# Patient Record
Sex: Female | Born: 1960 | Race: White | Marital: Married
Health system: Southern US, Community
[De-identification: ages and names within clinical notes are randomized; demographics above are authoritative.]

---

## 2016-08-22 ENCOUNTER — Other Ambulatory Visit: Payer: Self-pay | Admitting: Orthopedic Surgery

## 2016-08-22 DIAGNOSIS — M50323 Other cervical disc degeneration at C6-C7 level: Secondary | ICD-10-CM

## 2016-09-02 ENCOUNTER — Other Ambulatory Visit: Payer: Self-pay

## 2016-09-05 ENCOUNTER — Other Ambulatory Visit: Payer: Self-pay

## 2016-09-11 ENCOUNTER — Other Ambulatory Visit: Payer: Self-pay

## 2016-09-15 ENCOUNTER — Inpatient Hospital Stay
Admission: RE | Admit: 2016-09-15 | Discharge: 2016-09-15 | Disposition: A | Discharge status: Home / Self Care | Payer: BLUE CROSS/BLUE SHIELD | Source: Ambulatory Visit | Attending: Orthopedic Surgery | Admitting: Orthopedic Surgery

## 2016-09-23 ENCOUNTER — Encounter: Payer: Self-pay | Admitting: Radiology

## 2016-09-23 ENCOUNTER — Ambulatory Visit
Admission: RE | Admit: 2016-09-23 | Discharge: 2016-09-23 | Disposition: A | Discharge status: Home / Self Care | Payer: BLUE CROSS/BLUE SHIELD | Source: Ambulatory Visit | Attending: Orthopedic Surgery | Admitting: Orthopedic Surgery

## 2016-09-23 DIAGNOSIS — M50323 Other cervical disc degeneration at C6-C7 level: Secondary | ICD-10-CM

## 2016-09-23 MED ORDER — TRIAMCINOLONE ACETONIDE 40 MG/ML IJ SUSP (RADIOLOGY)
60.0000 mg | Freq: Once | INTRAMUSCULAR | Status: AC
  Administered 2016-09-23: 60 mg via EPIDURAL

## 2016-09-23 MED ORDER — DIAZEPAM 5 MG PO TABS
10.0000 mg | ORAL_TABLET | Freq: Once | ORAL | Status: AC
  Administered 2016-09-23: 10 mg via ORAL

## 2016-09-23 MED ORDER — IOPAMIDOL (ISOVUE-M 300) INJECTION 61%
1.0000 mL | Freq: Once | INTRAMUSCULAR | Status: AC | PRN
  Administered 2016-09-23: 1 mL via EPIDURAL

## 2016-09-23 NOTE — Discharge Instructions (Signed)

## 2016-10-20 ENCOUNTER — Ambulatory Visit: Payer: Self-pay | Admitting: Physician Assistant

## 2016-11-12 ENCOUNTER — Ambulatory Visit: Admit: 2016-11-12 | Payer: BLUE CROSS/BLUE SHIELD | Admitting: Orthopedic Surgery

## 2016-11-12 SURGERY — ANTERIOR CERVICAL DECOMPRESSION/DISCECTOMY FUSION 2 LEVELS
Anesthesia: General

## 2018-02-28 IMAGING — XA DG INJECT/[PERSON_NAME] INC NEEDLE/CATH/PLC EPI/CERV/THOR W/IMG
2 series · 2 of 2 positions shown · non-contrast
Comparison: none

CLINICAL DATA: Spondylosis without myelopathy. C5-6 and C6-7
stenosis. Neck pain and bilateral shoulder and arm symptoms.

[Series 1: ortho standard · 1 of 1 slices shown (1 of 2)]
[im 1/1]
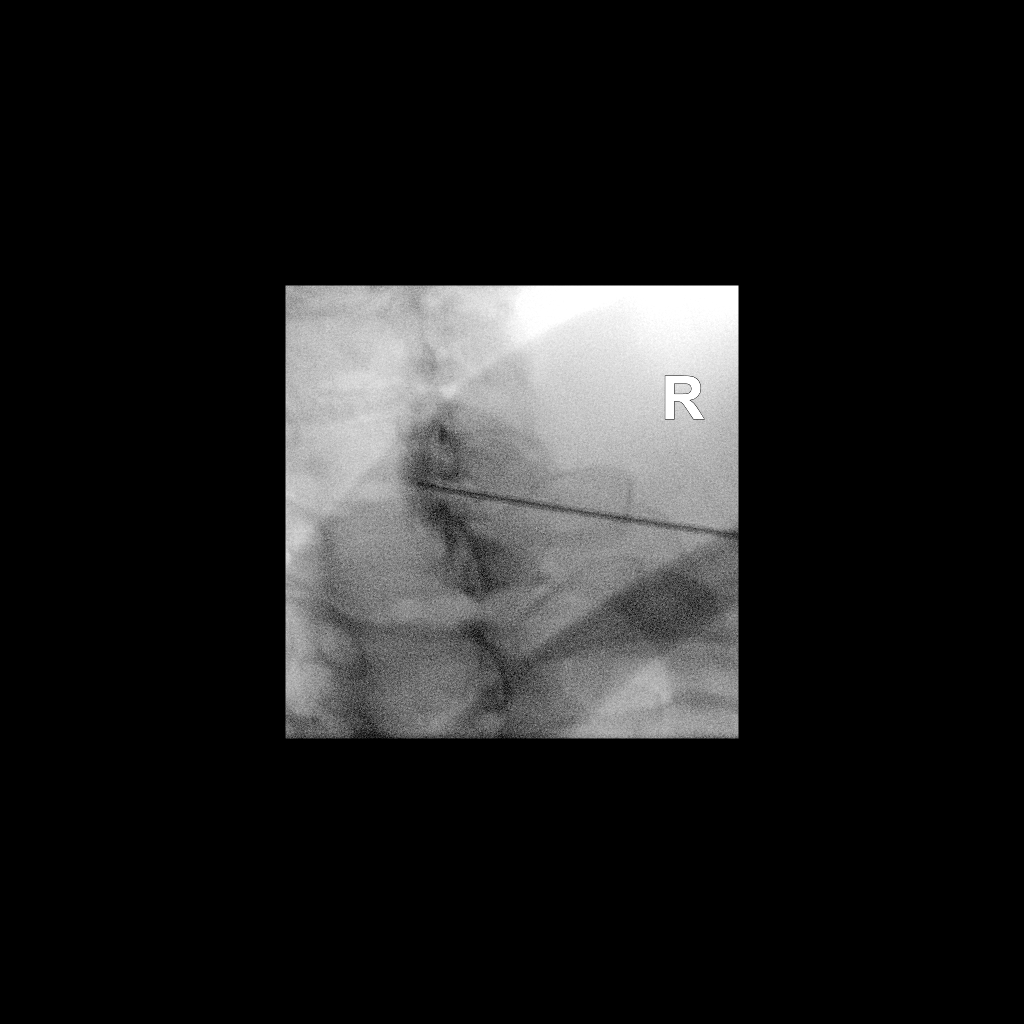

[Series 2: ortho standard · 1 of 1 slices shown (2 of 2)]
[im 1/1]
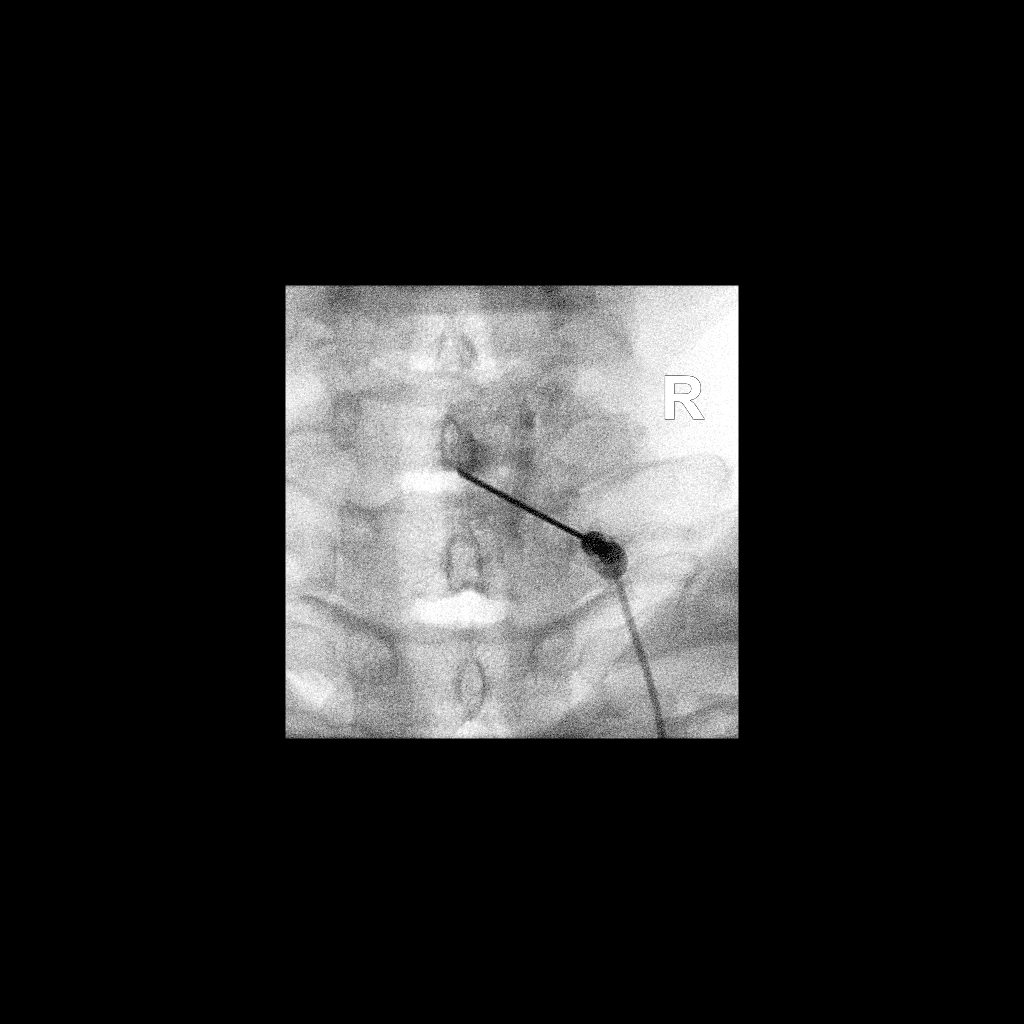

[2 of 2 positions shown; findings below may reference images not displayed]

FLUOROSCOPY TIME:  0 minutes 27 seconds. 8.36 micro gray meter
squared

PROCEDURE:
CERVICAL EPIDURAL INJECTION

An interlaminar approach was performed on the right at C7-T1 . A 20
gauge epidural needle was advanced using loss-of-resistance
technique.

DIAGNOSTIC EPIDURAL INJECTION

Injection of Isovue-M 300 shows a good epidural pattern with spread
above and below the level of needle placement, primarily on the
right, but to both sides. No vascular opacification is seen.
THERAPEUTIC

EPIDURAL INJECTION

1.5 ml of Kenalog 40 mixed with 1 ml of 1% Lidocaine and 2 ml of
normal saline were then instilled. The procedure was well-tolerated,
and the patient was discharged thirty minutes following the
injection in good condition.
IMPRESSION: Technically successful initial epidural injection on the right at
C7-T1.

## 2020-12-31 ENCOUNTER — Other Ambulatory Visit (HOSPITAL_COMMUNITY): Payer: Self-pay

## 2021-01-01 ENCOUNTER — Other Ambulatory Visit (HOSPITAL_COMMUNITY): Payer: Self-pay

## 2021-01-01 MED ORDER — HYDROCODONE-ACETAMINOPHEN 7.5-325 MG PO TABS
ORAL_TABLET | ORAL | 0 refills | Status: DC
  Filled 2021-01-01 (×2): qty 120, 30d supply, fill #0

## 2021-01-25 ENCOUNTER — Other Ambulatory Visit (HOSPITAL_COMMUNITY): Payer: Self-pay

## 2021-01-25 MED ORDER — HYDROCODONE-ACETAMINOPHEN 7.5-325 MG PO TABS
ORAL_TABLET | ORAL | 0 refills | Status: DC
  Filled 2021-01-31: qty 120, 30d supply, fill #0

## 2021-01-31 ENCOUNTER — Other Ambulatory Visit (HOSPITAL_COMMUNITY): Payer: Self-pay

## 2021-02-22 ENCOUNTER — Other Ambulatory Visit (HOSPITAL_COMMUNITY): Payer: Self-pay

## 2021-02-22 MED ORDER — HYDROCODONE-ACETAMINOPHEN 7.5-325 MG PO TABS
ORAL_TABLET | ORAL | 0 refills | Status: DC
  Filled 2021-03-02: qty 120, 30d supply, fill #0

## 2021-03-02 ENCOUNTER — Other Ambulatory Visit (HOSPITAL_COMMUNITY): Payer: Self-pay

## 2021-04-02 ENCOUNTER — Other Ambulatory Visit (HOSPITAL_COMMUNITY): Payer: Self-pay

## 2021-04-02 MED ORDER — HYDROCODONE-ACETAMINOPHEN 7.5-325 MG PO TABS
ORAL_TABLET | ORAL | 0 refills | Status: DC
  Filled 2021-04-02: qty 120, 30d supply, fill #0

## 2021-04-30 ENCOUNTER — Other Ambulatory Visit (HOSPITAL_COMMUNITY): Payer: Self-pay

## 2021-04-30 MED ORDER — HYDROCODONE-ACETAMINOPHEN 7.5-325 MG PO TABS
ORAL_TABLET | ORAL | 0 refills | Status: AC
  Filled 2021-04-30 – 2021-05-02 (×2): qty 120, 30d supply, fill #0

## 2021-05-02 ENCOUNTER — Other Ambulatory Visit (HOSPITAL_COMMUNITY): Payer: Self-pay

## 2021-05-31 ENCOUNTER — Other Ambulatory Visit (HOSPITAL_COMMUNITY): Payer: Self-pay

## 2021-05-31 MED ORDER — CYCLOBENZAPRINE HCL 10 MG PO TABS
ORAL_TABLET | ORAL | 0 refills | Status: DC
  Filled 2021-05-31: qty 45, 15d supply, fill #0

## 2021-05-31 MED ORDER — IBUPROFEN 800 MG PO TABS
ORAL_TABLET | ORAL | 0 refills | Status: DC
  Filled 2021-05-31: qty 30, 10d supply, fill #0

## 2021-05-31 MED ORDER — OXYCODONE HCL 5 MG PO TABS
ORAL_TABLET | ORAL | 0 refills | Status: DC
  Filled 2021-05-31: qty 120, 10d supply, fill #0

## 2021-06-14 ENCOUNTER — Other Ambulatory Visit (HOSPITAL_COMMUNITY): Payer: Self-pay

## 2021-06-14 MED ORDER — METHYLPREDNISOLONE 4 MG PO TBPK
ORAL_TABLET | ORAL | 0 refills | Status: AC
  Filled 2021-06-14: qty 21, 6d supply, fill #0

## 2021-06-25 ENCOUNTER — Other Ambulatory Visit (HOSPITAL_COMMUNITY): Payer: Self-pay

## 2021-06-25 MED ORDER — OXYCODONE HCL 5 MG PO TABS
ORAL_TABLET | ORAL | 0 refills | Status: AC
  Filled 2021-06-25: qty 28, 7d supply, fill #0

## 2021-07-02 ENCOUNTER — Other Ambulatory Visit (HOSPITAL_COMMUNITY): Payer: Self-pay

## 2021-07-02 MED ORDER — IBUPROFEN 800 MG PO TABS
ORAL_TABLET | ORAL | 0 refills | Status: DC
  Filled 2021-07-02: qty 30, 10d supply, fill #0

## 2021-07-02 MED ORDER — HYDROCODONE-ACETAMINOPHEN 10-325 MG PO TABS
ORAL_TABLET | ORAL | 0 refills | Status: DC
  Filled 2021-07-02: qty 120, 30d supply, fill #0

## 2021-07-02 MED ORDER — CYCLOBENZAPRINE HCL 10 MG PO TABS
ORAL_TABLET | ORAL | 0 refills | Status: DC
  Filled 2021-07-02: qty 45, 15d supply, fill #0

## 2021-08-02 ENCOUNTER — Other Ambulatory Visit (HOSPITAL_COMMUNITY): Payer: Self-pay

## 2021-08-02 MED ORDER — CYCLOBENZAPRINE HCL 10 MG PO TABS
ORAL_TABLET | ORAL | 0 refills | Status: AC
  Filled 2021-08-02: qty 45, 15d supply, fill #0

## 2021-08-02 MED ORDER — HYDROCODONE-ACETAMINOPHEN 10-325 MG PO TABS
ORAL_TABLET | ORAL | 0 refills | Status: AC
  Filled 2021-08-02: qty 120, 30d supply, fill #0

## 2021-08-02 MED ORDER — IBUPROFEN 800 MG PO TABS
ORAL_TABLET | ORAL | 0 refills | Status: AC
  Filled 2021-08-02: qty 30, 10d supply, fill #0

## 2021-08-30 ENCOUNTER — Other Ambulatory Visit (HOSPITAL_COMMUNITY): Payer: Self-pay

## 2021-08-30 MED ORDER — CYCLOBENZAPRINE HCL 10 MG PO TABS
10.0000 mg | ORAL_TABLET | Freq: Three times a day (TID) | ORAL | 0 refills | Status: AC | PRN
  Filled 2021-08-30: qty 45, 15d supply, fill #0

## 2021-08-30 MED ORDER — IBUPROFEN 800 MG PO TABS
800.0000 mg | ORAL_TABLET | Freq: Three times a day (TID) | ORAL | 0 refills | Status: AC | PRN
  Filled 2021-08-30: qty 30, 10d supply, fill #0

## 2021-08-30 MED ORDER — HYDROCODONE-ACETAMINOPHEN 10-325 MG PO TABS
1.0000 | ORAL_TABLET | Freq: Four times a day (QID) | ORAL | 0 refills | Status: AC | PRN
  Filled 2021-08-30: qty 120, 30d supply, fill #0

## 2021-10-01 ENCOUNTER — Other Ambulatory Visit (HOSPITAL_COMMUNITY): Payer: Self-pay

## 2021-10-01 MED ORDER — IBUPROFEN 800 MG PO TABS
ORAL_TABLET | ORAL | 0 refills | Status: AC
  Filled 2021-10-01: qty 30, 10d supply, fill #0

## 2021-10-01 MED ORDER — HYDROCODONE-ACETAMINOPHEN 10-325 MG PO TABS
ORAL_TABLET | ORAL | 0 refills | Status: AC
  Filled 2021-10-01: qty 120, 30d supply, fill #0

## 2021-10-31 ENCOUNTER — Other Ambulatory Visit (HOSPITAL_COMMUNITY): Payer: Self-pay

## 2021-10-31 MED ORDER — HYDROCODONE-ACETAMINOPHEN 10-325 MG PO TABS
1.0000 | ORAL_TABLET | Freq: Four times a day (QID) | ORAL | 0 refills | Status: DC | PRN
  Filled 2021-10-31: qty 120, 30d supply, fill #0

## 2021-10-31 MED ORDER — IBUPROFEN 800 MG PO TABS
800.0000 mg | ORAL_TABLET | Freq: Three times a day (TID) | ORAL | 0 refills | Status: DC | PRN
  Filled 2021-10-31: qty 30, 10d supply, fill #0

## 2021-11-18 ENCOUNTER — Other Ambulatory Visit (HOSPITAL_COMMUNITY): Payer: Self-pay

## 2021-11-18 MED ORDER — IBUPROFEN 800 MG PO TABS
800.0000 mg | ORAL_TABLET | Freq: Three times a day (TID) | ORAL | 0 refills | Status: AC | PRN
  Filled 2021-11-18: qty 30, 10d supply, fill #0

## 2021-11-18 MED ORDER — HYDROCODONE-ACETAMINOPHEN 10-325 MG PO TABS
1.0000 | ORAL_TABLET | Freq: Four times a day (QID) | ORAL | 0 refills | Status: AC | PRN
  Filled 2021-11-29: qty 120, 30d supply, fill #0

## 2021-11-29 ENCOUNTER — Other Ambulatory Visit (HOSPITAL_COMMUNITY): Payer: Self-pay

## 2021-12-25 ENCOUNTER — Other Ambulatory Visit (HOSPITAL_COMMUNITY): Payer: Self-pay

## 2021-12-25 MED ORDER — HYDROCODONE-ACETAMINOPHEN 10-325 MG PO TABS
1.0000 | ORAL_TABLET | Freq: Four times a day (QID) | ORAL | 0 refills | Status: DC | PRN
  Filled 2021-12-30: qty 120, 30d supply, fill #0

## 2021-12-25 MED ORDER — IBUPROFEN 800 MG PO TABS
800.0000 mg | ORAL_TABLET | Freq: Three times a day (TID) | ORAL | 0 refills | Status: AC | PRN
  Filled 2021-12-25: qty 30, 10d supply, fill #0

## 2021-12-30 ENCOUNTER — Other Ambulatory Visit (HOSPITAL_COMMUNITY): Payer: Self-pay

## 2021-12-31 ENCOUNTER — Other Ambulatory Visit (HOSPITAL_COMMUNITY): Payer: Self-pay

## 2022-01-01 ENCOUNTER — Other Ambulatory Visit (HOSPITAL_COMMUNITY): Payer: Self-pay

## 2022-01-03 ENCOUNTER — Other Ambulatory Visit (HOSPITAL_COMMUNITY): Payer: Self-pay

## 2022-01-06 ENCOUNTER — Other Ambulatory Visit (HOSPITAL_COMMUNITY): Payer: Self-pay

## 2022-01-24 ENCOUNTER — Other Ambulatory Visit (HOSPITAL_COMMUNITY): Payer: Self-pay

## 2022-01-24 MED ORDER — HYDROCODONE-ACETAMINOPHEN 10-325 MG PO TABS
1.0000 | ORAL_TABLET | Freq: Four times a day (QID) | ORAL | 0 refills | Status: AC | PRN
  Filled 2022-01-24 – 2022-02-04 (×2): qty 120, 30d supply, fill #0

## 2022-01-24 MED ORDER — HYDROCODONE-ACETAMINOPHEN 10-325 MG PO TABS
1.0000 | ORAL_TABLET | Freq: Four times a day (QID) | ORAL | 0 refills | Status: AC | PRN
  Filled 2022-02-04 – 2022-04-14 (×2): qty 120, 30d supply, fill #0

## 2022-02-04 ENCOUNTER — Other Ambulatory Visit (HOSPITAL_BASED_OUTPATIENT_CLINIC_OR_DEPARTMENT_OTHER): Payer: Self-pay

## 2022-02-04 ENCOUNTER — Other Ambulatory Visit (HOSPITAL_COMMUNITY): Payer: Self-pay

## 2022-02-04 MED ORDER — HYDROCODONE-ACETAMINOPHEN 10-325 MG PO TABS
1.0000 | ORAL_TABLET | Freq: Four times a day (QID) | ORAL | 0 refills | Status: AC | PRN
  Filled 2022-02-05: qty 120, 30d supply, fill #0

## 2022-02-05 ENCOUNTER — Other Ambulatory Visit (HOSPITAL_BASED_OUTPATIENT_CLINIC_OR_DEPARTMENT_OTHER): Payer: Self-pay

## 2022-02-05 ENCOUNTER — Other Ambulatory Visit (HOSPITAL_COMMUNITY): Payer: Self-pay

## 2022-02-25 ENCOUNTER — Other Ambulatory Visit (HOSPITAL_BASED_OUTPATIENT_CLINIC_OR_DEPARTMENT_OTHER): Payer: Self-pay

## 2022-02-25 MED ORDER — HYDROCODONE-ACETAMINOPHEN 10-325 MG PO TABS: 1.0000 | ORAL_TABLET | Freq: Four times a day (QID) | ORAL | 0 refills | Status: AC | PRN

## 2022-02-25 MED ORDER — NALOXONE HCL 4 MG/0.1ML NA LIQD
1.0000 | NASAL | 1 refills | Status: AC | PRN
  Filled 2022-02-25: qty 2, 30d supply, fill #0

## 2022-03-06 ENCOUNTER — Other Ambulatory Visit: Payer: Self-pay

## 2022-03-06 ENCOUNTER — Other Ambulatory Visit (HOSPITAL_BASED_OUTPATIENT_CLINIC_OR_DEPARTMENT_OTHER): Payer: Self-pay

## 2022-03-07 ENCOUNTER — Other Ambulatory Visit (HOSPITAL_BASED_OUTPATIENT_CLINIC_OR_DEPARTMENT_OTHER): Payer: Self-pay

## 2022-03-10 ENCOUNTER — Other Ambulatory Visit (HOSPITAL_BASED_OUTPATIENT_CLINIC_OR_DEPARTMENT_OTHER): Payer: Self-pay

## 2022-03-25 ENCOUNTER — Other Ambulatory Visit (HOSPITAL_COMMUNITY): Payer: Self-pay

## 2022-03-25 MED ORDER — HYDROCODONE-ACETAMINOPHEN 10-325 MG PO TABS
1.0000 | ORAL_TABLET | Freq: Four times a day (QID) | ORAL | 0 refills | Status: AC | PRN
  Filled 2022-03-25 – 2022-06-13 (×3): qty 120, 30d supply, fill #0

## 2022-04-08 ENCOUNTER — Other Ambulatory Visit (HOSPITAL_COMMUNITY): Payer: Self-pay

## 2022-04-10 ENCOUNTER — Other Ambulatory Visit (HOSPITAL_COMMUNITY): Payer: Self-pay

## 2022-04-10 ENCOUNTER — Other Ambulatory Visit (HOSPITAL_BASED_OUTPATIENT_CLINIC_OR_DEPARTMENT_OTHER): Payer: Self-pay

## 2022-04-11 ENCOUNTER — Other Ambulatory Visit (HOSPITAL_COMMUNITY): Payer: Self-pay

## 2022-04-11 ENCOUNTER — Other Ambulatory Visit (HOSPITAL_BASED_OUTPATIENT_CLINIC_OR_DEPARTMENT_OTHER): Payer: Self-pay

## 2022-04-11 MED ORDER — HYDROCODONE-ACETAMINOPHEN 10-325 MG PO TABS
1.0000 | ORAL_TABLET | Freq: Four times a day (QID) | ORAL | 0 refills | Status: AC | PRN
  Filled 2022-04-11: qty 120, 30d supply, fill #0

## 2022-04-12 ENCOUNTER — Other Ambulatory Visit (HOSPITAL_COMMUNITY): Payer: Self-pay

## 2022-04-14 ENCOUNTER — Other Ambulatory Visit (HOSPITAL_COMMUNITY): Payer: Self-pay

## 2022-04-22 ENCOUNTER — Other Ambulatory Visit (HOSPITAL_COMMUNITY): Payer: Self-pay

## 2022-04-22 MED ORDER — HYDROCODONE-ACETAMINOPHEN 10-325 MG PO TABS
1.0000 | ORAL_TABLET | Freq: Four times a day (QID) | ORAL | 0 refills | Status: AC | PRN
  Filled 2022-05-15: qty 120, 30d supply, fill #0

## 2022-05-15 ENCOUNTER — Other Ambulatory Visit: Payer: Self-pay

## 2022-05-15 ENCOUNTER — Other Ambulatory Visit (HOSPITAL_COMMUNITY): Payer: Self-pay

## 2022-05-23 ENCOUNTER — Other Ambulatory Visit (HOSPITAL_COMMUNITY): Payer: Self-pay

## 2022-05-23 MED ORDER — HYDROCODONE-ACETAMINOPHEN 10-325 MG PO TABS: 1.0000 | ORAL_TABLET | Freq: Four times a day (QID) | ORAL | 0 refills | Status: AC | PRN

## 2022-06-13 ENCOUNTER — Other Ambulatory Visit (HOSPITAL_COMMUNITY): Payer: Self-pay

## 2022-06-23 ENCOUNTER — Other Ambulatory Visit (HOSPITAL_COMMUNITY): Payer: Self-pay

## 2022-06-23 MED ORDER — HYDROCODONE-ACETAMINOPHEN 10-325 MG PO TABS
1.0000 | ORAL_TABLET | Freq: Four times a day (QID) | ORAL | 0 refills | Status: AC | PRN
  Filled 2022-06-23 – 2022-07-14 (×2): qty 120, 30d supply, fill #0

## 2022-07-14 ENCOUNTER — Other Ambulatory Visit (HOSPITAL_COMMUNITY): Payer: Self-pay

## 2022-07-24 ENCOUNTER — Other Ambulatory Visit (HOSPITAL_COMMUNITY): Payer: Self-pay

## 2022-07-24 MED ORDER — HYDROCODONE-ACETAMINOPHEN 10-325 MG PO TABS
1.0000 | ORAL_TABLET | Freq: Four times a day (QID) | ORAL | 0 refills | Status: AC | PRN
  Filled 2022-08-13: qty 120, 30d supply, fill #0

## 2022-08-13 ENCOUNTER — Other Ambulatory Visit (HOSPITAL_COMMUNITY): Payer: Self-pay

## 2022-08-22 ENCOUNTER — Other Ambulatory Visit (HOSPITAL_COMMUNITY): Payer: Self-pay

## 2022-08-22 MED ORDER — HYDROCODONE-ACETAMINOPHEN 10-325 MG PO TABS
1.0000 | ORAL_TABLET | Freq: Four times a day (QID) | ORAL | 0 refills | Status: AC | PRN
  Filled 2022-09-13: qty 120, 30d supply, fill #0

## 2022-08-25 ENCOUNTER — Other Ambulatory Visit (HOSPITAL_COMMUNITY): Payer: Self-pay

## 2022-09-13 ENCOUNTER — Other Ambulatory Visit (HOSPITAL_COMMUNITY): Payer: Self-pay

## 2022-09-19 ENCOUNTER — Other Ambulatory Visit (HOSPITAL_COMMUNITY): Payer: Self-pay

## 2022-09-19 MED ORDER — HYDROCODONE-ACETAMINOPHEN 10-325 MG PO TABS
ORAL_TABLET | ORAL | 0 refills | Status: AC
  Filled 2022-10-14: qty 120, 30d supply, fill #0

## 2022-10-14 ENCOUNTER — Other Ambulatory Visit (HOSPITAL_COMMUNITY): Payer: Self-pay

## 2022-10-20 ENCOUNTER — Other Ambulatory Visit (HOSPITAL_COMMUNITY): Payer: Self-pay

## 2022-10-20 MED ORDER — HYDROCODONE-ACETAMINOPHEN 10-325 MG PO TABS
1.0000 | ORAL_TABLET | Freq: Four times a day (QID) | ORAL | 0 refills | Status: AC
  Filled 2022-10-20 – 2022-11-13 (×2): qty 120, 30d supply, fill #0

## 2022-11-13 ENCOUNTER — Other Ambulatory Visit (HOSPITAL_COMMUNITY): Payer: Self-pay

## 2022-11-19 ENCOUNTER — Other Ambulatory Visit (HOSPITAL_COMMUNITY): Payer: Self-pay

## 2022-11-19 MED ORDER — HYDROCODONE-ACETAMINOPHEN 10-325 MG PO TABS
1.0000 | ORAL_TABLET | Freq: Four times a day (QID) | ORAL | 0 refills | Status: AC | PRN
  Filled 2022-12-13: qty 120, 30d supply, fill #0

## 2022-11-20 ENCOUNTER — Other Ambulatory Visit (HOSPITAL_COMMUNITY): Payer: Self-pay

## 2022-12-13 ENCOUNTER — Other Ambulatory Visit (HOSPITAL_COMMUNITY): Payer: Self-pay

## 2022-12-19 ENCOUNTER — Other Ambulatory Visit (HOSPITAL_COMMUNITY): Payer: Self-pay

## 2022-12-19 MED ORDER — HYDROCODONE-ACETAMINOPHEN 10-325 MG PO TABS
1.0000 | ORAL_TABLET | Freq: Four times a day (QID) | ORAL | 0 refills | Status: AC
  Filled 2022-12-19 – 2023-01-13 (×2): qty 120, 30d supply, fill #0

## 2023-01-13 ENCOUNTER — Other Ambulatory Visit (HOSPITAL_COMMUNITY): Payer: Self-pay

## 2023-01-16 ENCOUNTER — Other Ambulatory Visit (HOSPITAL_COMMUNITY): Payer: Self-pay

## 2023-01-16 MED ORDER — HYDROCODONE-ACETAMINOPHEN 10-325 MG PO TABS
1.0000 | ORAL_TABLET | Freq: Four times a day (QID) | ORAL | 0 refills | Status: DC | PRN
  Filled 2023-01-16 – 2023-02-13 (×2): qty 120, 30d supply, fill #0

## 2023-02-13 ENCOUNTER — Other Ambulatory Visit (HOSPITAL_COMMUNITY): Payer: Self-pay

## 2023-03-16 ENCOUNTER — Other Ambulatory Visit (HOSPITAL_COMMUNITY): Payer: Self-pay

## 2023-03-17 ENCOUNTER — Other Ambulatory Visit (HOSPITAL_COMMUNITY): Payer: Self-pay

## 2023-03-17 MED ORDER — HYDROCODONE-ACETAMINOPHEN 10-325 MG PO TABS
1.0000 | ORAL_TABLET | Freq: Four times a day (QID) | ORAL | 0 refills | Status: DC | PRN
  Filled 2023-03-17: qty 120, 30d supply, fill #0

## 2023-04-14 ENCOUNTER — Other Ambulatory Visit (HOSPITAL_COMMUNITY): Payer: Self-pay

## 2023-04-14 MED ORDER — HYDROCODONE-ACETAMINOPHEN 10-325 MG PO TABS
1.0000 | ORAL_TABLET | Freq: Four times a day (QID) | ORAL | 0 refills | Status: DC | PRN
  Filled 2023-04-16: qty 120, 30d supply, fill #0

## 2023-04-14 MED ORDER — NALOXONE HCL 4 MG/0.1ML NA LIQD
1.0000 | Freq: Every day | NASAL | 1 refills | Status: AC | PRN
  Filled 2023-04-14: qty 2, 2d supply, fill #0

## 2023-04-15 ENCOUNTER — Other Ambulatory Visit (HOSPITAL_COMMUNITY): Payer: Self-pay

## 2023-04-16 ENCOUNTER — Other Ambulatory Visit (HOSPITAL_COMMUNITY): Payer: Self-pay

## 2023-05-15 ENCOUNTER — Other Ambulatory Visit (HOSPITAL_COMMUNITY): Payer: Self-pay

## 2023-05-15 ENCOUNTER — Other Ambulatory Visit (HOSPITAL_BASED_OUTPATIENT_CLINIC_OR_DEPARTMENT_OTHER): Payer: Self-pay

## 2023-05-15 MED ORDER — HYDROCODONE-ACETAMINOPHEN 10-325 MG PO TABS
1.0000 | ORAL_TABLET | Freq: Four times a day (QID) | ORAL | 0 refills | Status: DC | PRN
  Filled 2023-05-15 – 2023-05-16 (×2): qty 120, 30d supply, fill #0

## 2023-05-16 ENCOUNTER — Other Ambulatory Visit (HOSPITAL_COMMUNITY): Payer: Self-pay

## 2023-05-18 ENCOUNTER — Other Ambulatory Visit (HOSPITAL_COMMUNITY): Payer: Self-pay

## 2023-06-16 ENCOUNTER — Other Ambulatory Visit (HOSPITAL_COMMUNITY): Payer: Self-pay

## 2023-06-16 MED ORDER — HYDROCODONE-ACETAMINOPHEN 10-325 MG PO TABS
1.0000 | ORAL_TABLET | Freq: Four times a day (QID) | ORAL | 0 refills | Status: DC | PRN
  Filled 2023-06-16: qty 120, 30d supply, fill #0

## 2023-06-17 ENCOUNTER — Other Ambulatory Visit (HOSPITAL_COMMUNITY): Payer: Self-pay

## 2023-07-14 ENCOUNTER — Other Ambulatory Visit (HOSPITAL_COMMUNITY): Payer: Self-pay

## 2023-07-14 MED ORDER — HYDROCODONE-ACETAMINOPHEN 10-325 MG PO TABS
1.0000 | ORAL_TABLET | Freq: Four times a day (QID) | ORAL | 0 refills | Status: AC | PRN
  Filled 2023-07-14: qty 120, 30d supply, fill #0

## 2023-07-17 ENCOUNTER — Other Ambulatory Visit (HOSPITAL_COMMUNITY): Payer: Self-pay

## 2023-08-12 ENCOUNTER — Other Ambulatory Visit (HOSPITAL_COMMUNITY): Payer: Self-pay

## 2023-08-12 MED ORDER — HYDROCODONE-ACETAMINOPHEN 10-325 MG PO TABS
1.0000 | ORAL_TABLET | Freq: Four times a day (QID) | ORAL | 0 refills | Status: AC | PRN
  Filled 2023-08-13 – 2023-08-17 (×2): qty 120, 30d supply, fill #0

## 2023-08-13 ENCOUNTER — Other Ambulatory Visit (HOSPITAL_COMMUNITY): Payer: Self-pay

## 2023-08-17 ENCOUNTER — Other Ambulatory Visit (HOSPITAL_COMMUNITY): Payer: Self-pay

## 2023-09-12 ENCOUNTER — Other Ambulatory Visit (HOSPITAL_COMMUNITY): Payer: Self-pay

## 2023-09-12 MED ORDER — HYDROCODONE-ACETAMINOPHEN 10-325 MG PO TABS
1.0000 | ORAL_TABLET | Freq: Four times a day (QID) | ORAL | 0 refills | Status: AC
  Filled 2023-09-16 (×2): qty 120, 30d supply, fill #0

## 2023-09-16 ENCOUNTER — Other Ambulatory Visit (HOSPITAL_COMMUNITY): Payer: Self-pay

## 2023-10-14 ENCOUNTER — Other Ambulatory Visit (HOSPITAL_COMMUNITY): Payer: Self-pay

## 2023-10-14 MED ORDER — HYDROCODONE-ACETAMINOPHEN 10-325 MG PO TABS
1.0000 | ORAL_TABLET | Freq: Four times a day (QID) | ORAL | 0 refills | Status: DC | PRN
  Filled 2023-10-16: qty 120, 30d supply, fill #0

## 2023-10-16 ENCOUNTER — Other Ambulatory Visit (HOSPITAL_COMMUNITY): Payer: Self-pay

## 2023-11-12 ENCOUNTER — Other Ambulatory Visit (HOSPITAL_COMMUNITY): Payer: Self-pay

## 2023-11-12 MED ORDER — HYDROCODONE-ACETAMINOPHEN 10-325 MG PO TABS
1.0000 | ORAL_TABLET | Freq: Four times a day (QID) | ORAL | 0 refills | Status: DC | PRN
  Filled 2023-11-12 – 2023-11-13 (×2): qty 120, 30d supply, fill #0

## 2023-11-13 ENCOUNTER — Other Ambulatory Visit (HOSPITAL_COMMUNITY): Payer: Self-pay

## 2023-11-15 ENCOUNTER — Other Ambulatory Visit (HOSPITAL_COMMUNITY): Payer: Self-pay

## 2023-12-11 ENCOUNTER — Other Ambulatory Visit (HOSPITAL_COMMUNITY): Payer: Self-pay

## 2023-12-11 MED ORDER — HYDROCODONE-ACETAMINOPHEN 10-325 MG PO TABS
1.0000 | ORAL_TABLET | Freq: Four times a day (QID) | ORAL | 0 refills | Status: DC | PRN
  Filled 2023-12-11: qty 120, 30d supply, fill #0

## 2023-12-14 ENCOUNTER — Other Ambulatory Visit (HOSPITAL_COMMUNITY): Payer: Self-pay

## 2023-12-21 ENCOUNTER — Other Ambulatory Visit (HOSPITAL_COMMUNITY): Payer: Self-pay

## 2024-01-11 ENCOUNTER — Other Ambulatory Visit (HOSPITAL_COMMUNITY): Payer: Self-pay

## 2024-01-12 ENCOUNTER — Other Ambulatory Visit (HOSPITAL_COMMUNITY): Payer: Self-pay

## 2024-01-14 ENCOUNTER — Other Ambulatory Visit (HOSPITAL_COMMUNITY): Payer: Self-pay

## 2024-01-15 ENCOUNTER — Other Ambulatory Visit (HOSPITAL_COMMUNITY): Payer: Self-pay

## 2024-01-15 ENCOUNTER — Other Ambulatory Visit: Payer: Self-pay

## 2024-01-15 MED ORDER — HYDROCODONE-ACETAMINOPHEN 10-325 MG PO TABS
1.0000 | ORAL_TABLET | Freq: Four times a day (QID) | ORAL | 0 refills | Status: DC | PRN
  Filled 2024-01-15 (×2): qty 120, 30d supply, fill #0

## 2024-02-10 ENCOUNTER — Other Ambulatory Visit (HOSPITAL_COMMUNITY): Payer: Self-pay

## 2024-02-10 MED ORDER — HYDROCODONE-ACETAMINOPHEN 10-325 MG PO TABS
1.0000 | ORAL_TABLET | Freq: Four times a day (QID) | ORAL | 0 refills | Status: DC | PRN
  Filled 2024-02-10 – 2024-02-14 (×2): qty 120, 30d supply, fill #0

## 2024-02-14 ENCOUNTER — Other Ambulatory Visit (HOSPITAL_COMMUNITY): Payer: Self-pay

## 2024-02-14 ENCOUNTER — Other Ambulatory Visit: Payer: Self-pay

## 2024-02-15 ENCOUNTER — Other Ambulatory Visit (HOSPITAL_COMMUNITY): Payer: Self-pay

## 2024-03-10 ENCOUNTER — Other Ambulatory Visit (HOSPITAL_COMMUNITY): Payer: Self-pay

## 2024-03-10 MED ORDER — HYDROCODONE-ACETAMINOPHEN 10-325 MG PO TABS: 1.0000 | ORAL_TABLET | Freq: Four times a day (QID) | ORAL | 0 refills | Status: AC | PRN

## 2024-03-11 ENCOUNTER — Other Ambulatory Visit (HOSPITAL_COMMUNITY): Payer: Self-pay
